# Patient Record
Sex: Female | Born: 1985 | Race: White | Hispanic: Yes | Marital: Married | Smoking: Never smoker
Health system: Southern US, Community
[De-identification: ages and names within clinical notes are randomized; demographics above are authoritative.]

## PROBLEM LIST (undated history)

## (undated) DIAGNOSIS — K649 Unspecified hemorrhoids: Secondary | ICD-10-CM

## (undated) HISTORY — DX: Unspecified hemorrhoids: K64.9

## (undated) HISTORY — PX: BREAST ENHANCEMENT SURGERY: SHX7

## (undated) HISTORY — PX: ABDOMINOPLASTY: SUR9

## (undated) HISTORY — PX: AUGMENTATION MAMMAPLASTY: SUR837

---

## 2015-01-26 HISTORY — PX: HEMORRHOID SURGERY: SHX153

## 2018-07-02 ENCOUNTER — Encounter: Payer: Self-pay | Admitting: *Deleted

## 2018-07-14 ENCOUNTER — Encounter: Payer: Self-pay | Admitting: Obstetrics & Gynecology

## 2018-07-19 ENCOUNTER — Encounter: Payer: Self-pay | Admitting: *Deleted

## 2018-07-21 ENCOUNTER — Ambulatory Visit (INDEPENDENT_AMBULATORY_CARE_PROVIDER_SITE_OTHER): Payer: Medicaid Other | Admitting: Obstetrics & Gynecology

## 2018-07-21 ENCOUNTER — Encounter: Payer: Self-pay | Admitting: Obstetrics & Gynecology

## 2018-07-21 VITALS — BP 117/76 | HR 69

## 2018-07-21 DIAGNOSIS — N644 Mastodynia: Secondary | ICD-10-CM | POA: Diagnosis present

## 2018-07-21 NOTE — Progress Notes (Signed)
Patient ID: Cynthia Huang, female   DOB: 1985/12/02, 32 y.o.   MRN: [id number]  Chief Complaint  Patient presents with  . Ovarian Cyst    HPI Cynthia Huang is a 32 y.o. female.  Single P1 28(34 yo daughter) here today with the complaint of RLQ pain. Present since 03/25/18 when her IUD (Paragard) was placed in [place]. She is not sure what kind of IUD she had in before that one.  She has also been having irregular periods since getting this IUD.  She also reports some pain in her right breast. She had [place].She denies nipple discharge. HPI  Past Medical History:  Diagnosis Date  . Hemorrhoids     Past Surgical History:  Procedure Laterality Date  . ABDOMINOPLASTY    . BREAST ENHANCEMENT SURGERY     twice  . CESAREAN SECTION    . HEMORRHOID SURGERY  01/2015   x3 due to malpractice    History reviewed. No pertinent family history.  Social History Social History   Tobacco Use  . Smoking status: Never Smoker  . Smokeless tobacco: Never Used  Substance Use Topics  . Alcohol use: Never    Frequency: Never  . Drug use: Never    No Known Allergies  Current Outpatient Medications  Medication Sig Dispense Refill  . Ascorbic Acid (VITAMIN C PO) Take 1 tablet by mouth daily.    Marland Kitchen. FOLIC ACID PO Take 1 tablet by mouth daily.    . Multiple Vitamin (MULTIVITAMIN) tablet Take 2 tablets by mouth daily.     No current facility-administered medications for this visit.     Review of Systems Review of Systems  Blood pressure 117/76, pulse 69, last menstrual period 07/07/2018.  Physical Exam Physical Exam  Breathing, conversing, and ambulating normally Well nourished, well hydrated Latina, no apparent distress  Live interpretor present for exam Breast exam normal bilaterally (implants present) Bimanual exam normal  Data Reviewed An u/s done 9/19 showed a 2 cm normal cyst on the left side (not the side of her  pain).   Assessment    RLQ pain- new onset since getting the Paragard. I have suggested that we remove it.  I have offered OCPs or Nexplanon  Right breast pain   She complains of pain in her breast and abdominoplasty scars  Plan    Diagnostic mammogram ordered I have suggested that she see a plastic surgeon regarding her scars       Allie BossierMyra C Liesa Tsan 07/21/2018, 3:41 PM

## 2018-07-21 NOTE — Progress Notes (Signed)
Here for c/o pain RLQ. [facility]

## 2018-07-27 ENCOUNTER — Other Ambulatory Visit: Payer: Self-pay | Admitting: Obstetrics & Gynecology

## 2018-07-27 DIAGNOSIS — N644 Mastodynia: Secondary | ICD-10-CM

## 2019-03-02 ENCOUNTER — Other Ambulatory Visit: Payer: Self-pay | Admitting: Internal Medicine

## 2019-03-02 DIAGNOSIS — Z1231 Encounter for screening mammogram for malignant neoplasm of breast: Secondary | ICD-10-CM

## 2019-04-04 ENCOUNTER — Ambulatory Visit: Payer: Medicaid Other | Attending: Sports Medicine | Admitting: Physical Therapy

## 2019-04-04 ENCOUNTER — Encounter: Payer: Self-pay | Admitting: Physical Therapy

## 2019-04-04 ENCOUNTER — Other Ambulatory Visit: Payer: Self-pay

## 2019-04-04 DIAGNOSIS — M62838 Other muscle spasm: Secondary | ICD-10-CM

## 2019-04-04 DIAGNOSIS — M25561 Pain in right knee: Secondary | ICD-10-CM

## 2019-04-04 DIAGNOSIS — M6281 Muscle weakness (generalized): Secondary | ICD-10-CM

## 2019-04-04 NOTE — Therapy (Signed)
[facility] [address], [address] [place], [place], [address] Phone: [phone]   Fax:  [phone]  Physical Therapy Evaluation  Patient Details  Name: Cynthia Huang MRN: [id number] Date of Birth: 06-20-1986 Referring Provider (PT): Pati GalloKramer, James, MD   Encounter Date: 04/04/2019  PT End of Session - 04/04/19 1620    Visit Number  1    Date for PT Re-Evaluation  06/13/19    Authorization Type  medicaid    PT Start Time  1533    PT Stop Time  1616    PT Time Calculation (min)  43 min    Activity Tolerance  Patient tolerated treatment well    Behavior During Therapy  [facility] for tasks assessed/performed       History reviewed. No pertinent past medical history.  Past Surgical History:  Procedure Laterality Date  . CESAREAN SECTION      There were no vitals filed for this visit.   Subjective Assessment - 04/04/19 1537    Subjective  Pt states she had to see a doctor for the pain two weeks ago.  I have been working out and have had a little bit of pain on and off but never something that limited me.  Now I cannot exercise    Patient is accompained by:  Interpreter    Limitations  Other (comment)   exercise   How long can you sit comfortably?  sitting 30 minutes increases pain and have to straighten leg    Patient Stated Goals  be able to exercise    Currently in Pain?  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    Referring Provider (PT)  Pati GalloKramer, James, MD    Onset Date/Surgical Date  --   2 weeks ago increased but has been ongoing for years   Prior Therapy  No      Precautions   Precautions  None      Restrictions   Weight Bearing Restrictions  No      Balance Screen   Has the patient fallen in the past 6 months  No      Home Environment   Living Environment  Private residence    Living Arrangements  Non-relatives/Friends;Children   daughter     Prior Function   Level of Independence  Independent    Vocation  Unemployed   due to COVID19 not been working   [employer] Requirements  pt was full time before COVID19; standing or sitting for long times    Leisure  exercise and running      Cognition   Overall Cognitive Status  Within Functional Limits for tasks assessed      Posture/Postural Control   Posture/Postural Control  No significant limitations      ROM / Strength   AROM / PROM / Strength  Strength      Strength   Strength Assessment Site  Hip;Knee    Right/Left Hip  Right;Left    Right Hip Flexion  5/5    Right Hip Extension  4-/5    Right Hip External Rotation   4-/5   pain   Right Hip Internal Rotation  4+/5    Right Hip ABduction  4-/5   pain   Right Hip ADduction  3+/5    Left Hip Flexion  5/5    Left Hip Extension  4+/5    Left Hip External Rotation  5/5    Left Hip Internal Rotation  5/5    Left Hip ABduction  5/5    Left Hip ADduction  4/5    Right/Left Knee  Right;Left    Right Knee Extension  4/5    Left Knee Extension  4/5      Flexibility   Soft Tissue Assessment /Muscle Length  yes    Hamstrings  right 10% limited    ITB  right 50% limite      Palpation   Patella mobility  hypomobile Rt side all directions    Palpation comment  trigger points and spasms along vastus lateralis; tight IT band      Special Tests    Special Tests  Lumbar    Lumbar Tests  Straight Leg Raise      Straight Leg Raise   Findings  Negative    Comment   bilat      Ambulation/Gait   Gait Pattern  Within Functional Limits                Objective measurements completed on examination: See above findings.              PT Education - 04/04/19 1619    Education Details  dry needling info and using spiky roller on thigh    Person(s) Educated  Patient    Methods  Explanation;Demonstration    Comprehension  Verbalized understanding;Returned demonstration          PT Long Term Goals - 04/04/19 1621      PT LONG TERM GOAL #1   Title  Pt will be able to run for at least 20 minutes without being limited by pain    Baseline  pain increases after 10 minutes, cannot run currently    Time  10    Period  Weeks    Status  New    Target Date  06/13/19      PT LONG TERM GOAL #2   Title  Pt will report 75% less pain     Baseline  up to 9/10 pain    Time  10    Period  Weeks    Status  New    Target Date  06/13/19      PT LONG TERM GOAL #3   Title  Pt will be able to sit for at least one hour without increased pain    Baseline  30 minuts causes increased pain    Time  10    Period  Weeks    Status  New    Target Date  06/13/19      PT LONG TERM GOAL #4   Title  Pt will demonstrate Rt hip abduction and adduction strength of MMT 5/5 for improved ability to run and return  to recreational activities.    Time  10    Period  Weeks    Status  New    Target Date  06/13/19      PT LONG TERM GOAL #5   Title  Pt will be able to ascend stairs reciprocally without increased pain    Baseline  up to 9/10 pain    Time  10    Period  Weeks    Status  New    Target Date  06/13/19             Plan - 04/04/19 1625    Clinical Impression Statement  Pt presents to the clinic today with Right knee pain that seems to radiate from mid lateral thigh/ITband region.  Pt is having pain when she runs and goes up and down stairs.  Pain began 2 weeks ago but she has had this pain to lesser degree for years.  Pt demonstrates LE  weakness as mentioned above.  She has trigger points along her lateral quad and very tight ITband.  Pt has decreased patellar mobility with patella tracking laterally during knee extension.  Pt is negative SLR test to rule out lumbar involvement.  Pt will benefit from skilled PT to improve strength and mobility for return to all recreational activities for healthy lifestyle.    Personal Factors and Comorbidities  Comorbidity 2    Comorbidities  chronic pain, c-section    Examination-Activity Limitations  Stairs    Examination-Participation Restrictions  Other    Stability/Clinical Decision Making  Stable/Uncomplicated    Clinical Decision Making  Low    Rehab Potential  Excellent    PT Frequency  2x / week    PT Duration  Other (comment)   10 weeks   PT Treatment/Interventions  ADLs/Self Care Home Management;Biofeedback;Cryotherapy;Electrical Stimulation;Iontophoresis 4mg /ml Dexamethasone;Moist Heat;Stair training;Gait training;Therapeutic activities;Therapeutic exercise;Neuromuscular re-education;Patient/family education;Manual techniques;Passive range of motion;Dry needling;Taping    PT Next Visit Plan  dry needling right vastus lateralis, tape Right knee to correct excess lateral glide, hip and quad strength    Consulted and Agree with Plan of Care  Patient       Patient will benefit from skilled therapeutic intervention in order to improve the following deficits and impairments:  Increased muscle spasms, Decreased strength, Pain, Hypomobility  Visit Diagnosis: Acute pain of right knee - Plan: PT plan of care cert/re-cert  Muscle weakness (generalized) - Plan: PT plan of care cert/re-cert  Other muscle spasm - Plan: PT plan of care cert/re-cert     Problem List There are no active problems to display for this patient.   Brayton CavesJakki L Marielle Mantione 04/04/2019, 6:12 PM  [facility] [address], [address] [place], [place],  [address] Phone: [phone]   Fax:  [phone]  Name: Cynthia Huang MRN: [id number] Date of Birth: 1986-07-12

## 2019-04-04 NOTE — Patient Instructions (Signed)

## 2019-04-06 ENCOUNTER — Encounter: Payer: Self-pay | Admitting: Obstetrics & Gynecology

## 2019-04-13 ENCOUNTER — Encounter: Payer: Self-pay | Admitting: Physical Therapy

## 2019-04-13 ENCOUNTER — Other Ambulatory Visit: Payer: Self-pay

## 2019-04-13 ENCOUNTER — Ambulatory Visit: Payer: Medicaid Other | Admitting: Physical Therapy

## 2019-04-13 DIAGNOSIS — M62838 Other muscle spasm: Secondary | ICD-10-CM

## 2019-04-13 DIAGNOSIS — M25561 Pain in right knee: Secondary | ICD-10-CM

## 2019-04-13 DIAGNOSIS — M6281 Muscle weakness (generalized): Secondary | ICD-10-CM

## 2019-04-13 NOTE — Therapy (Signed)
[facility] [address], [address] [place], [place], [address] Phone: [phone]   Fax:  [phone]  Physical Therapy Treatment  Patient Details  Name: Cynthia Huang MRN: [id number] Date of Birth: 07-01-1986 Referring Provider (PT): Pati GalloKramer, James, MD   Encounter Date: 04/13/2019  PT End of Session - 04/13/19 1523    Visit Number  2    Date for PT Re-Evaluation  06/13/19    Authorization Type  medicaid    PT Start Time  1523    PT Stop Time  1605    PT Time Calculation (min)  42 min    Activity Tolerance  Patient tolerated treatment well    Behavior During Therapy  [facility] for tasks assessed/performed       Past Medical History:  Diagnosis Date  . Hemorrhoids     Past Surgical History:  Procedure Laterality Date  . ABDOMINOPLASTY    . BREAST ENHANCEMENT SURGERY     twice  . CESAREAN SECTION    . HEMORRHOID SURGERY  01/2015   x3 due to malpractice    There were no vitals filed for this visit.  Subjective Assessment - 04/13/19 1528    Subjective  Pt states she has a little pain when she sits for a long time but other than that feels completely better. She has not returned to running yet.    Currently in Pain?  No/denies                       [facility] - 04/13/19 0001      Exercises   Exercises  Knee/Hip      Knee/Hip Exercises: Stretches   ITB Stretch  Right;3 reps;20 seconds      Knee/Hip Exercises: Standing   Step Down  Step Height: 4";Hand Hold: 2;10 reps   cues for alignment   Other Standing Knee Exercises  sliders 3 ways with cues for LE and pelvic aligned neutral positions - 10x each way      Knee/Hip Exercises: Sidelying   Other Sidelying Knee/Hip Exercises  foam roll to IT band and TFL      Manual Therapy   Manual Therapy  Soft tissue mobilization    Soft tissue mobilization  TFL, and quads - trigger point release       Trigger Point  Dry Needling - 04/13/19 0001    Consent Given?  Yes    Education Handout Provided  Previously provided    Muscles Treated Lower Quadrant  Vastus lateralis;Vastus medialis    Other Dry Needling  TFL - twitch and increased muscle length noted    Vastus lateralis Response  Twitch response elicited;Palpable increased muscle length    Vastus medialis Response  Twitch response elicited;Palpable increased muscle length                PT Long Term Goals - 04/13/19 1611      PT LONG TERM GOAL #1   Title  Pt will be able to run for at least 20 minutes without being limited by pain    Baseline  no pain currently but has not tried running    Status  On-going      PT LONG TERM GOAL #2   Title  Pt will report 75% less pain     Baseline  no pain    Status  Achieved      PT LONG TERM GOAL #3  Title  Pt will be able to sit for at least one hour without increased pain    Baseline  less pain with sitting, very little    Status  On-going      PT LONG TERM GOAL #4   Title  Pt will demonstrate Rt hip abduction and adduction strength of MMT 5/5 for improved ability to run and return to recreational activities.    Baseline  just learned exercises    Status  On-going      PT LONG TERM GOAL #5   Title  Pt will be able to ascend stairs reciprocally without increased pain    Baseline  only a very little pain    Status  On-going            Plan - 04/13/19 1612    Clinical Impression Statement  Pt states she hasn't been having any pain and she is feeling much better.  She states rolling out her leg has helped a lot.  Pt feels like she may not need more PT, however PT recommends to continue at least one more visit to ensure she can return to her normal activities.  She needed cues with single leg exercises due to weakness in hips.  Pt also had tirgger points along vastus lateralis and TFL muscle responded well to manaul and dry needling techniques.  Pt will benefit from skilled PT to progress  strength and work on jumping and hopping activities so she can safely return to sports.    PT Treatment/Interventions  ADLs/Self Care Home Management;Biofeedback;Cryotherapy;Electrical Stimulation;Iontophoresis 4mg /ml Dexamethasone;Moist Heat;Stair training;Gait training;Therapeutic activities;Therapeutic exercise;Neuromuscular re-education;Patient/family education;Manual techniques;Passive range of motion;Dry needling;Taping    PT Next Visit Plan  f/u on dry needling, hopping and jumping with good technique so she can return to sports    PT Home Exercise Plan  Your Access Code [phone]    Recommended Other Services  cert signed    Consulted and Agree with Plan of Care  Patient       Patient will benefit from skilled therapeutic intervention in order to improve the following deficits and impairments:  Increased muscle spasms, Decreased strength, Pain, Hypomobility  Visit Diagnosis: 1. Acute pain of right knee   2. Muscle weakness (generalized)   3. Other muscle spasm        Problem List There are no active problems to display for this patient.   Camillo Flaming Cynthia Huang, PT 04/13/2019, 4:24 PM  [facility] [address], [place], [place], [address] Phone: [phone]   Fax:  [phone]  Name: Cynthia Huang MRN: [id number] Date of Birth: May 30, 1986

## 2019-04-20 ENCOUNTER — Encounter: Payer: Self-pay | Admitting: Physical Therapy

## 2019-04-20 ENCOUNTER — Other Ambulatory Visit: Payer: Self-pay

## 2019-04-20 ENCOUNTER — Ambulatory Visit: Payer: Medicaid Other | Admitting: Physical Therapy

## 2019-04-20 DIAGNOSIS — M6281 Muscle weakness (generalized): Secondary | ICD-10-CM

## 2019-04-20 DIAGNOSIS — M25561 Pain in right knee: Secondary | ICD-10-CM

## 2019-04-20 DIAGNOSIS — M62838 Other muscle spasm: Secondary | ICD-10-CM

## 2019-04-20 NOTE — Patient Instructions (Signed)
Access Code: [email or web address]  URL: [email or web address]  Date: 04/20/2019  Prepared by: Jari Favre   Exercises  Forward Step Down - 10 reps - 3 sets - 1x daily - 7x weekly  3-Way Lunge on Slider - 10 reps - 3 sets - 1x daily - 7x weekly  Reverse Lunge on Slider - 10 reps - 3 sets - 1x daily - 7x weekly  Standing ITB Stretch - 10 reps - 3 sets - 1x daily - 7x weekly  Sidelying IT Band Foam Roll Mobilization - 1 reps - 1 sets - 60 sec hold - 1x daily - 7x weekly  Squat Jumps - 10 reps - 2 sets - 1x daily - 7x weekly  Lateral Single Leg Lunge Jumps - 10 reps - 2 sets - 1x daily - 7x weekly  Standing Quadriceps Stretch - 3 reps - 1 sets - 30 sec hold - 1x daily - 7x weekly  Supine Piriformis Stretch with Foot on Ground - 3 reps - 1 sets - 30 sec hold - 1x daily - 7x weekly

## 2019-04-20 NOTE — Therapy (Addendum)
[facility] [address], [place], [place], [address] Phone: [phone]   Fax:  [phone]  Physical Therapy Treatment  Patient Details  Name: Cynthia Huang MRN: [id number] Date of Birth: 1985-12-12 Referring Provider (PT): Berle Mull, MD   Encounter Date: 04/20/2019  PT End of Session - 04/20/19 1533    Visit Number  3    Date for PT Re-Evaluation  06/13/19    Authorization Type  medicaid    PT Start Time  1532    PT Stop Time  1610    PT Time Calculation (min)  38 min    Activity Tolerance  Patient tolerated treatment well    Behavior During Therapy  [facility] for tasks assessed/performed       Past Medical History:  Diagnosis Date  . Hemorrhoids     Past Surgical History:  Procedure Laterality Date  . ABDOMINOPLASTY    . BREAST ENHANCEMENT SURGERY     twice  . CESAREAN SECTION    . HEMORRHOID SURGERY  01/2015   x3 due to malpractice    There were no vitals filed for this visit.  Subjective Assessment - 04/20/19 1538    Subjective  Pt states she has a little pain when she sits for a long time.    How long can you sit comfortably?  30-40 minutes    Currently in Pain?  No/denies                       [facility] Adult PT Treatment/Exercise - 04/20/19 0001      Neuro Re-ed    Neuro Re-ed Details   knee alignment during exercises      Knee/Hip Exercises: Stretches   Active Hamstring Stretch  Right;Left;3 reps;20 seconds    Quad Stretch  Right;Left;3 reps;20 seconds    Hip Flexor Stretch  Right;Left;3 reps;20 seconds    ITB Stretch  Right;3 reps;20 seconds    Piriformis Stretch  Right;Left;3 reps;20 seconds    Gastroc Stretch  Right;Left;1 rep;30 seconds      Knee/Hip Exercises: Plyometrics   Other Plyometric Exercises  jump squats - 10x with mirror for cues    Other Plyometric Exercises  lateral single leg hop - 10x each way      Manual Therapy   Soft tissue  mobilization  TFL, and quads - trigger point release       Trigger Point Dry Needling - 04/20/19 0001    Consent Given?  Yes    Other Dry Needling  TFL - twitch and increased muscle length noted    Vastus lateralis Response  Twitch response elicited;Palpable increased muscle length           PT Education - 04/20/19 1617    Education Details  Access Code: [email or web address]    Person(s) Educated  Patient    Methods  Explanation;Demonstration;Tactile cues;Verbal cues;Handout    Comprehension  Verbalized understanding;Returned demonstration          PT Long Term Goals - 04/13/19 1611      PT LONG TERM GOAL #1   Title  Pt will be able to run for at least 20 minutes without being limited by pain    Baseline  no pain currently but has not tried running    Status  On-going      PT LONG TERM GOAL #2   Title  Pt will report 75% less pain     Baseline  no pain    Status  Achieved      PT LONG TERM GOAL #3   Title  Pt will be able to sit for at least one hour without increased pain    Baseline  less pain with sitting, very little    Status  On-going      PT LONG TERM GOAL #4   Title  Pt will demonstrate Rt hip abduction and adduction strength of MMT 5/5 for improved ability to run and return to recreational activities.    Baseline  just learned exercises    Status  On-going      PT LONG TERM GOAL #5   Title  Pt will be able to ascend stairs reciprocally without increased pain    Baseline  only a very little pain    Status  On-going            Plan - 04/20/19 1619    Clinical Impression Statement  Pt did well working with mirror to show the instability in Rt LE/hip.  Pt continues to have trigger points along TFL and latera quadricep.  She has overall improvements and is able to sit longer with less pain.  She has not tried running yet.  She will benefit from skilled PT to re-assess and progress HEP as needed after she tries running again.    PT Treatment/Interventions   ADLs/Self Care Home Management;Biofeedback;Cryotherapy;Electrical Stimulation;Iontophoresis 1m/ml Dexamethasone;Moist Heat;Stair training;Gait training;Therapeutic activities;Therapeutic exercise;Neuromuscular re-education;Patient/family education;Manual techniques;Passive range of motion;Dry needling;Taping    PT Next Visit Plan  f/u on response to running    PT Home Exercise Plan  Your Access Code [email or web address]   Consulted and Agree with Plan of Care  Patient       Patient will benefit from skilled therapeutic intervention in order to improve the following deficits and impairments:  Increased muscle spasms, Decreased strength, Pain, Hypomobility  Visit Diagnosis: 1. Acute pain of right knee   2. Muscle weakness (generalized)   3. Other muscle spasm        Problem List There are no active problems to display for this patient.   JJule Ser PT 04/20/2019, 4:26 PM  [facility] [address] [place] [address]Phone: [phone]  Fax:  [phone] Name: Cynthia CalvarioMRN: [id number] of Birth: 426-Sep-1987 PHYSICAL THERAPY DISCHARGE SUMMARY  Visits from Start of Care: 3  Current functional level related to goals / functional outcomes: See above goals   Remaining deficits: See above details   Education / Equipment: HEP  Plan: Patient agrees to discharge.  Patient goals were partially met. Patient is being discharged due to not returning since the last visit.  ?????    [employer] PT 06/23/19 11:00 AM

## 2019-04-25 ENCOUNTER — Encounter: Payer: Medicaid Other | Admitting: Physical Therapy

## 2019-04-27 ENCOUNTER — Other Ambulatory Visit: Payer: Self-pay | Admitting: Internal Medicine

## 2019-04-27 ENCOUNTER — Other Ambulatory Visit: Payer: Self-pay

## 2019-04-27 ENCOUNTER — Ambulatory Visit
Admission: RE | Admit: 2019-04-27 | Discharge: 2019-04-27 | Disposition: A | Discharge status: Home / Self Care | Payer: Medicaid Other | Source: Ambulatory Visit | Attending: Internal Medicine | Admitting: Internal Medicine

## 2019-04-27 DIAGNOSIS — Z1231 Encounter for screening mammogram for malignant neoplasm of breast: Secondary | ICD-10-CM

## 2019-10-04 IMAGING — MG DIGITAL SCREENING BILATERAL MAMMOGRAM WITH IMPLANTS, CAD AND TOM
9 of 12 series · 9 of 28 positions shown · non-contrast
Comparison: Previous exam(s).

CLINICAL DATA: Screening.

EXAM:
DIGITAL SCREENING BILATERAL MAMMOGRAM WITH IMPLANTS, CAD AND TOMO
The patient has prepectoral implants. Standard and implant displaced
views were performed.

[R MLO]
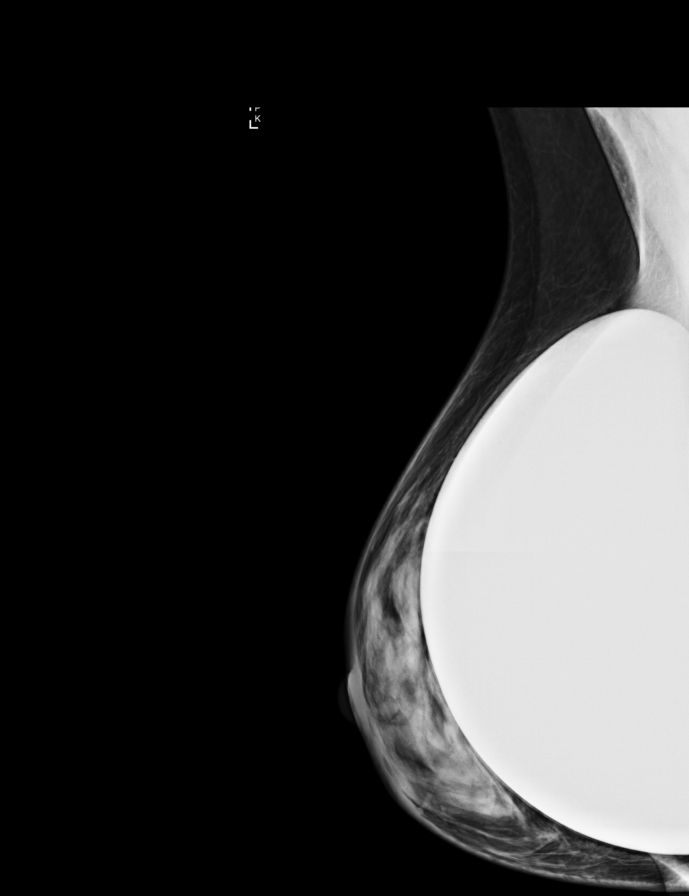

[L MLO]
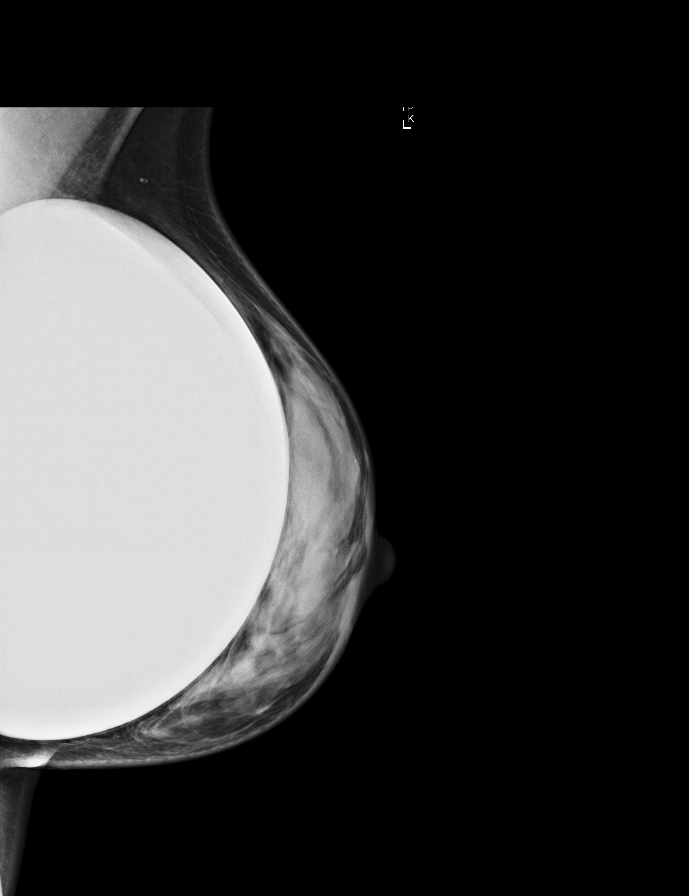

[L CC]
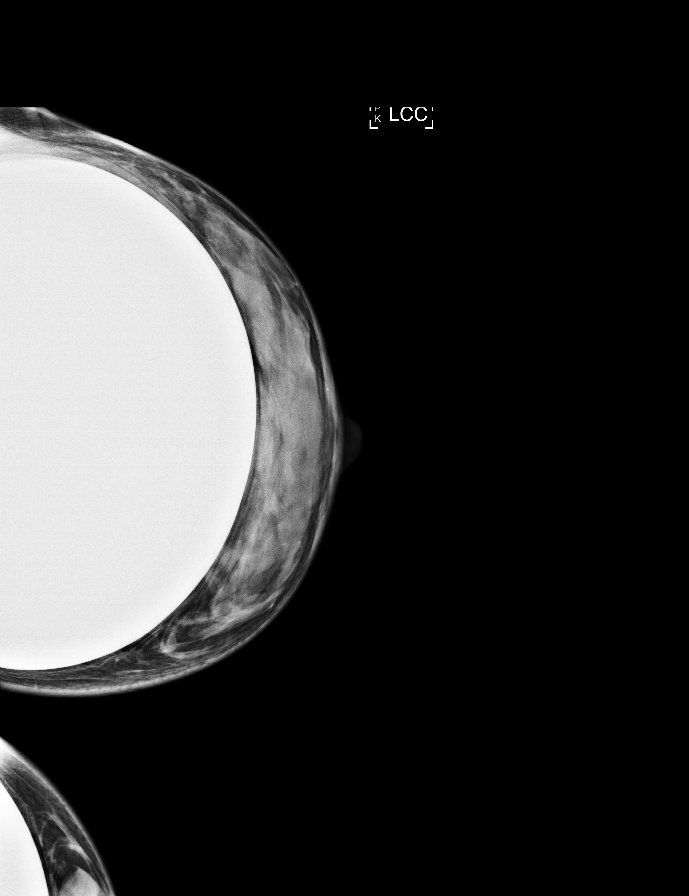

[R CC]
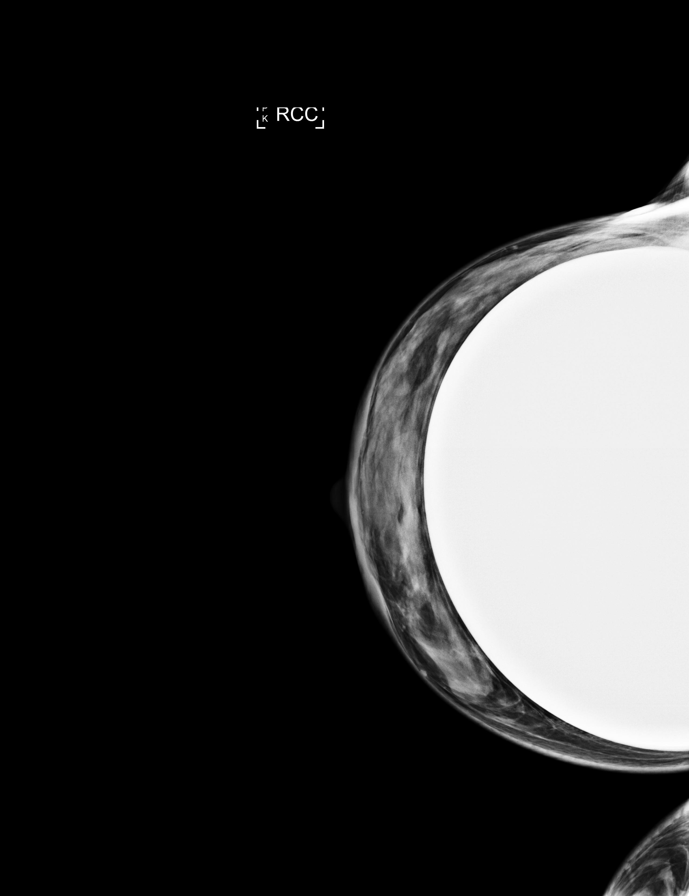

[L CC synth-2D]
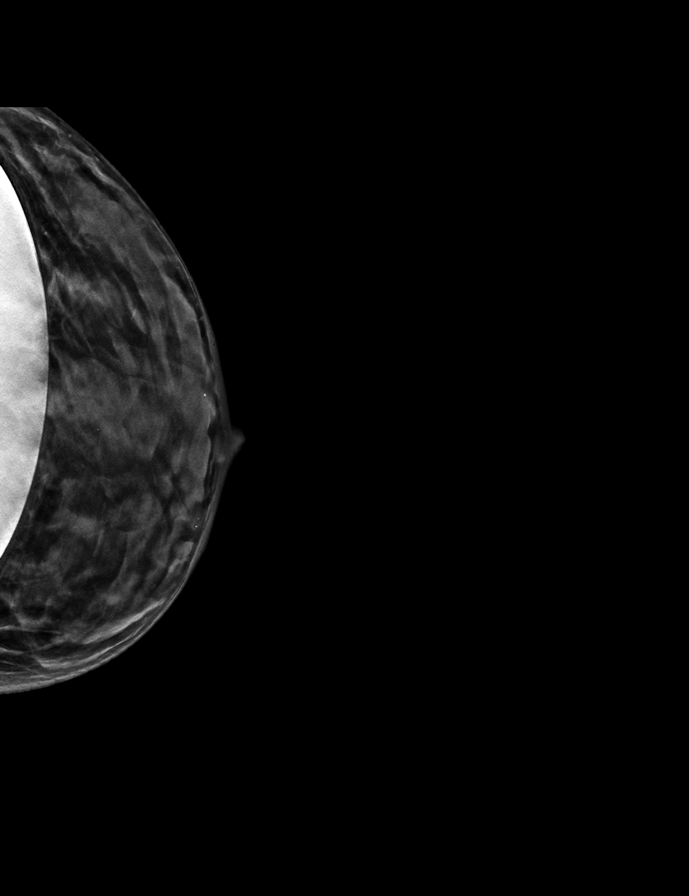

[R MLO synth-2D]
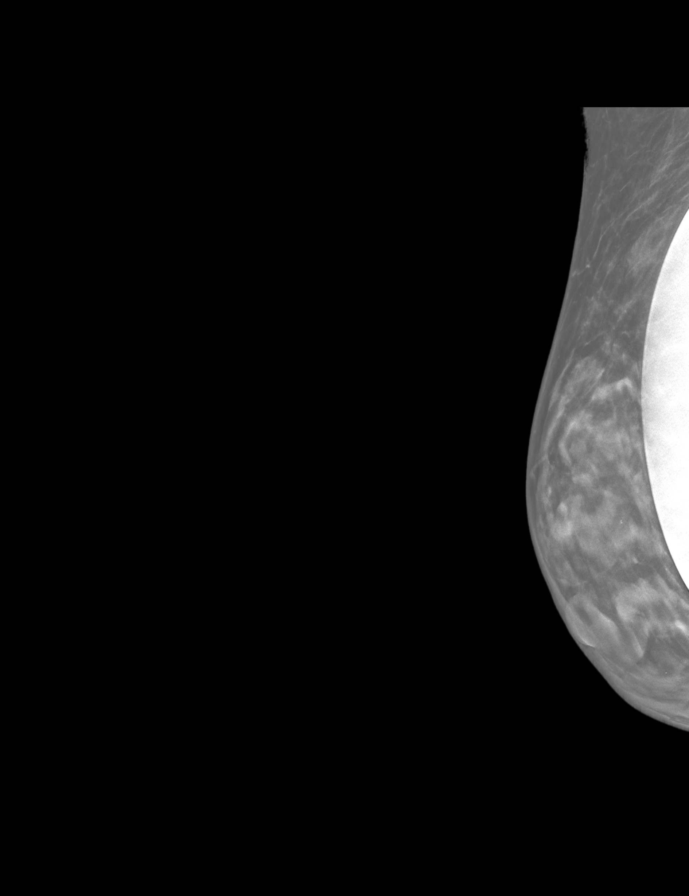

[L MLO synth-2D]
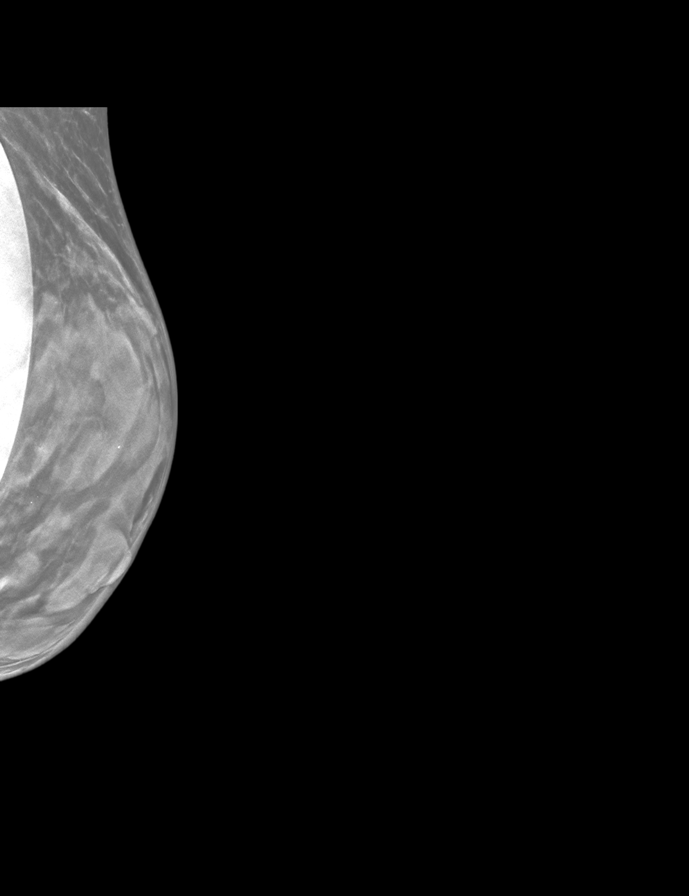

[R CC synth-2D]
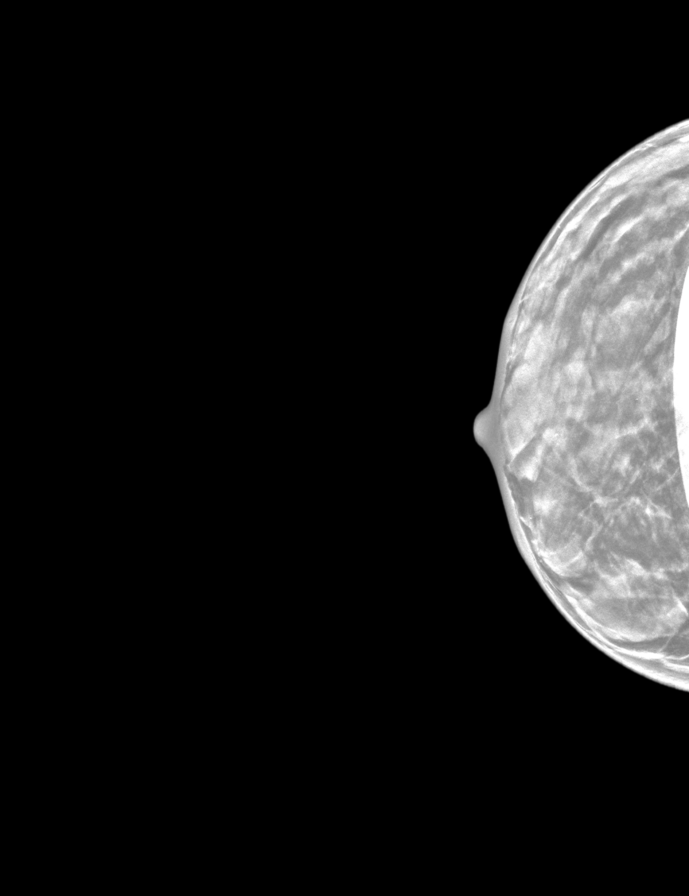

[R MLOID BREAST TOMOSYNTHESIS IMAGE tomo · tomo slice 21/41.0]
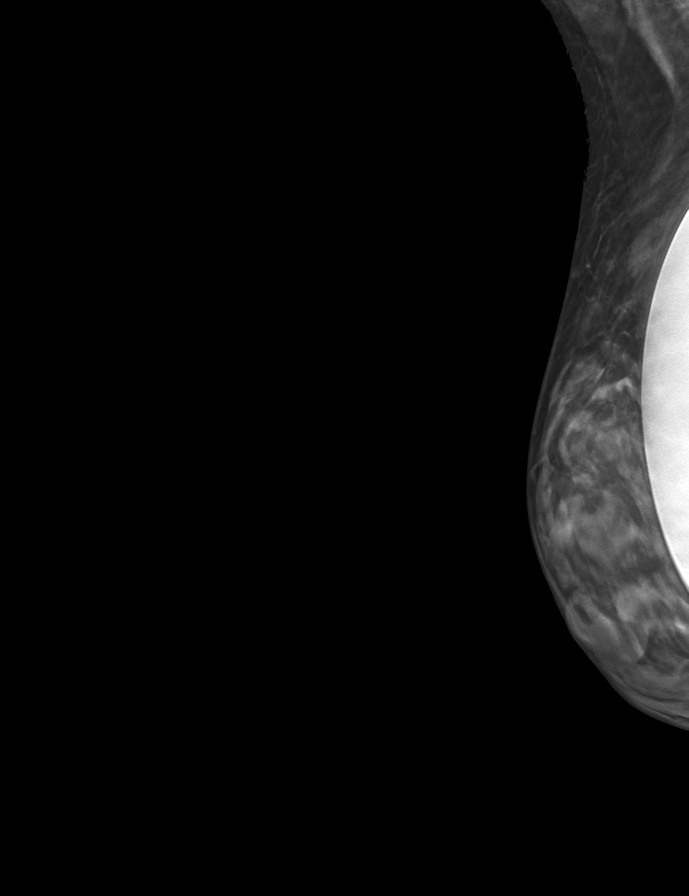

[9 of 28 positions shown; findings below may reference images not displayed]

ACR Breast Density Category d: The breast tissue is extremely dense,
which lowers the sensitivity of mammography.
FINDINGS: There are no findings suspicious for malignancy. Images were
processed with CAD.
IMPRESSION: No mammographic evidence of malignancy. A result letter of this
screening mammogram will be mailed directly to the patient.

RECOMMENDATION:
Screening mammogram at age 38. (Code:67-9-R43)

BI-RADS CATEGORY  1:  Negative.

## 2021-10-10 ENCOUNTER — Ambulatory Visit: Payer: Medicaid Other | Admitting: Infectious Diseases

## 2021-10-16 ENCOUNTER — Other Ambulatory Visit: Payer: Self-pay

## 2021-10-16 ENCOUNTER — Encounter: Payer: Self-pay | Admitting: Infectious Diseases

## 2021-10-16 ENCOUNTER — Ambulatory Visit (INDEPENDENT_AMBULATORY_CARE_PROVIDER_SITE_OTHER): Payer: Medicaid Other | Admitting: Infectious Diseases

## 2021-10-16 DIAGNOSIS — L03011 Cellulitis of right finger: Secondary | ICD-10-CM

## 2021-10-16 DIAGNOSIS — R768 Other specified abnormal immunological findings in serum: Secondary | ICD-10-CM

## 2021-10-16 NOTE — Patient Instructions (Addendum)
You could take zinc 50 mg once a day for wound healing Also Vitamin A through a multivitamin may be helpful too for wound healing  Vitamin D once daily I recommend everyone to take also.   I would like to discuss further with Dr. Mina Marble about you tomorrow. You have been on a lot of antibiotics that I would have suspected would have worked by now so we need to be more thoughtful about what we should do next for you.

## 2021-10-16 NOTE — Progress Notes (Signed)
Patient: Cynthia Huang  DOB: 02/04/1986 MRN: [id number] PCP: Pcp, No  Referring Provider: Dr. Dairl Ponder   Subjective:  Alinda Sierras de Jayme Cloud is a 35 y.o. 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   She declined an interpretor today. Her husband / partner is present.   Condition started on October 7th, 2022 with fairly rapid onset erythema, pain/tenderness and swelling of the nailbed of her right middle finger. She sought care with PCP @ [facility] after the condition worsened over 1 week on Nov 7th. They tried to drain it in the office - this yielded no purulent material, only blood. She was treated with a 7 day course of Augmentin at that time. With no improvement and now worsened swelling they added doxycycline to this and referred to Dr. Mina Marble for further assistance. She states she noticed very little to no improvement at this point.   She does not recall any injury to the nail bed. Does her own nails at home, no gel or acrylics. Does not manicure with any instruments just paints them usually. She does not bite her nails and had no human or animal bite at work or home. Washes hands frequently at work and home with medical grade soap. 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 Has been out of work since end of October.   She saw Dr. Mina Marble - given a course of Bactrim on 11/07 and had procedure November 16th where he elevated the nail plate off the finger, cleaned debri with curette to epiconchial fold and obtained cultures. Cultures were negative. Continued to have bad pain, swelling and intermittent drainage. X'rays revealed no bone infection. Started on a second round of bactrim and diflucan late November and ultimately underwent nail bed removal on December 2nd. She says that at first after the nail was removed the pain was worse but the swelling that was present down the finger shaft started to improve but  persisted around the nail bed.   Started on systemic steroids recently 12/19 - this is the second rx for it but she confides she did not take it at first because of concern over impact on her immune system. She feels a lot better today and notes a significant improvement the last 24 hours - no pain or drainage. She is not on any antibiotics at this moment but using cetaphil soap soaking with antibiotic creams. She keeps it covered all of the time.    She has had a lot of blood work drawn with PCP and a neurologist including immunoglobulins, thyroid studies, lyme studies, ANA and RF amongst others.  RA panel elevated at 30; ANA negative, CRP negative. Lyme WB with few reactive IgG bands but ultimately negative interpretation.   Leaving for [place] on Friday and returning on January 2nd.    Review of Systems  Constitutional:  Negative for chills, fever, malaise/fatigue and weight loss.  HENT:  Negative for sore throat.   Respiratory:  Negative for cough and sputum production.   Cardiovascular:  Negative for chest pain and leg swelling.  Gastrointestinal:  Negative for abdominal pain, diarrhea and vomiting.  Genitourinary:  Negative for dysuria and flank pain.  Musculoskeletal:  Positive for joint pain (Rt long finger tip). Negative for myalgias and neck pain.  Skin:  Negative for rash.  Neurological:  Negative for dizziness, tingling and headaches.  Psychiatric/Behavioral:  Negative for depression and substance abuse. The patient is not nervous/anxious and  does not have insomnia.     Past Medical History:  Diagnosis Date   Hemorrhoids     Outpatient Medications Prior to Visit  Medication Sig Dispense Refill   Ascorbic Acid (VITAMIN C PO) Take 1 tablet by mouth daily.     FOLIC ACID PO Take 1 tablet by mouth daily.     Multiple Vitamin (MULTIVITAMIN) tablet Take 2 tablets by mouth daily.     No facility-administered medications prior to visit.     No Known Allergies  Social  History   Tobacco Use   Smoking status: Never   Smokeless tobacco: Never  Substance Use Topics   Alcohol use: Never   Drug use: Never    No family history on file.  Objective:   Vitals:   10/16/21 1604  BP: 114/77  Pulse: 77  Temp: 98.5 F (36.9 C)  TempSrc: Oral  Weight: 117 lb (53.1 kg)   There is no height or weight on file to calculate BMI.  Physical Exam Constitutional:      Appearance: Normal appearance. She is not ill-appearing.  HENT:     Mouth/Throat:     Mouth: Mucous membranes are moist.     Pharynx: Oropharynx is clear.  Eyes:     General: No scleral icterus. Pulmonary:     Effort: Pulmonary effort is normal.  Neurological:     Mental Status: She is oriented to person, place, and time.  Psychiatric:        Mood and Affect: Mood normal.        Thought Content: Thought content normal.    10/16/2021   November 8th     Lab Results: No results found for: WBC, HGB, HCT, MCV, PLT No results found for: CREATININE, BUN, NA, K, CL, CO2 No results found for: ALT, AST, GGT, ALKPHOS, BILITOT   Assessment & Plan:   Problem List Items Addressed This Visit       Unprioritized   Elevated rheumatoid factor    She does not have any clinical symptoms c/w RA from our discussion. Unclear if this is only indicative of acute phase reactant vs positive test preceeding illness. She has an appt with Rheumatology soon.  ANA negative.  No evidence of Raynauds suggested on exam or history that would lead to delay in healing.  It is not clear to my why she would have needed Lyme testing but the few bands could be cross reactive from another process yet to be realized.       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 She has been treated with multiple rounds of appropriate antibiotics targeting the most common bacteria for acute paronychia including augmentin,  doxy, bactrim, and diflucan. Most recent antibiotic course Bactrim from 11/28 - 12/12. Ultimately she underwent nail plate removal on December 2nd. She does not report that she experienced much improvement on any of the antimicrobials. She is currently on systemic steroid for 6 days and cetaphil soaks. She confides she was nervous about using the steroid and did not take it at first when initially prescribed because of impact on her immune system but out of desperation started it recently.  She has noticed a significant amount of improvement the last 24 hours with this treatment.  On exam she has residual swelling around the nailbed with no pain/tenderness with firm pressure, no erythema, no drainage. She has had 2  xrays one most recently 12/19 - though I don't have a report or way to manipulate the images they report that Dr. Mina Marble reports no concern for bone infection. Would recommend to hold further abx and continue current steroid and soak rx. Even with chronic fungal involvement steroids seem to be more effective (topical or systemic).   She is wondering when she can go back to work (has been out since October) - I told her I would rather [place] see more than 24 hours of improvement given she exposes her hands to washing/cleaning products frequently at work.   Will have her back in to see one of our MDs when she returns from a trip to [place] early January to follow closely.   I called to speak with Dr. Mina Marble about the plan to hold on antibiotics for now and patient's improvement recently.       Total Encounter Time: 45 min   Rexene Alberts, MSN, NP-C [facility] Pager: [phone] Office: [phone]  10/17/21  11:18 AM

## 2021-10-17 DIAGNOSIS — R768 Other specified abnormal immunological findings in serum: Secondary | ICD-10-CM | POA: Insufficient documentation

## 2021-10-17 DIAGNOSIS — L03011 Cellulitis of right finger: Secondary | ICD-10-CM | POA: Insufficient documentation

## 2021-10-17 NOTE — Assessment & Plan Note (Signed)
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 She has been treated with multiple rounds of appropriate antibiotics targeting the most common bacteria for acute paronychia including augmentin, doxy, bactrim, and diflucan. Most recent antibiotic course Bactrim from 11/28 - 12/12. Ultimately she underwent nail plate removal on December 2nd. She does not report that she experienced much improvement on any of the antimicrobials. She is currently on systemic steroid for 6 days and cetaphil soaks. She confides she was nervous about using the steroid and did not take it at first when initially prescribed because of impact on her immune system but out of desperation started it recently.  She has noticed a significant amount of improvement the last 24 hours with this treatment.  On exam she has residual swelling around the nailbed with no pain/tenderness with firm pressure, no erythema, no drainage. She has had 2 xrays one most recently 12/19 - though I don't have a report or way to manipulate the images they report that Dr. Mina Marble reports no concern for bone infection. Would recommend to hold further abx and continue current steroid and soak rx. Even with chronic fungal involvement steroids seem to be more effective (topical or systemic).   She is wondering when she can go back to work (has been out since October) - I told her I would rather [place] see more than 24 hours of improvement given she exposes her hands to washing/cleaning products frequently at work.   Will have her back in to see one of our MDs when she returns from a trip to [place] early January to follow closely.   I called to speak with Dr. Mina Marble about the plan to hold on antibiotics for now and patient's improvement recently.

## 2021-10-17 NOTE — Assessment & Plan Note (Signed)
She does not have any clinical symptoms c/w RA from our discussion. Unclear if this is only indicative of acute phase reactant vs positive test preceeding illness. She has an appt with Rheumatology soon.  ANA negative.  No evidence of Raynauds suggested on exam or history that would lead to delay in healing.  It is not clear to my why she would have needed Lyme testing but the few bands could be cross reactive from another process yet to be realized.

## 2021-11-04 ENCOUNTER — Ambulatory Visit (INDEPENDENT_AMBULATORY_CARE_PROVIDER_SITE_OTHER): Payer: Medicaid Other | Admitting: Infectious Diseases

## 2021-11-04 ENCOUNTER — Other Ambulatory Visit: Payer: Self-pay

## 2021-11-04 ENCOUNTER — Encounter: Payer: Self-pay | Admitting: Infectious Diseases

## 2021-11-04 DIAGNOSIS — L03011 Cellulitis of right finger: Secondary | ICD-10-CM

## 2021-11-04 NOTE — Patient Instructions (Addendum)
I don't think you have any ongoing bacterial infection causing the swelling in your finger.   Topical Triamcinolone to the nailbed may be helpful since you have had improvement on the steroid pill. This may be helpful for you to avoid having to take steroids by mouth too long. I would see how your finger does the rest of this week and if the swelling comes back call Dr. Ronie Spies team about starting this topical cream.   Compared to a few weeks ago I would say you do look better.   If you have some features of Raynaud's Phenomenon I would take care to keep your hands warm and see below. This may be a reason you have been slow to heal if you do have some of these features.   Raynaud's Phenomenon Raynaud's phenomenon is a condition that affects the blood vessels (arteries) that carry blood to the fingers and toes. The arteries that supply blood to the ears, lips, nipples, or the tip of the nose might also be affected. Raynaud's phenomenon causes the arteries to become narrow temporarily (spasm). As a result, the flow of blood to the affected areas is temporarily decreased. This usually occurs in response to cold temperatures or stress. During an attack, the skin in the affected areas turns white, then blue, and finally red. A person may also feel tingling or numbness in those areas. Attacks usually last for only a brief period, and then the blood flow to the area returns to normal. In most cases, Raynaud's phenomenon does not cause serious health problems. What are the causes? In many cases, the cause of this condition is not known. The condition may occur on its own (primary Raynaud's phenomenon) or may be associated with other diseases or factors (secondary Raynaud's phenomenon). Possible causes may include: Diseases or medical conditions that damage the arteries. Injuries and repetitive actions that hurt the hands or feet. Being exposed to certain chemicals. Taking medicines that narrow the  arteries. Other medical conditions, such as lupus, scleroderma, rheumatoid arthritis, thyroid problems, blood disorders, Sjogren syndrome, or atherosclerosis. What increases the risk? The following factors may make you more likely to develop this condition: Being 48-59 years old. Being female. Having a family history of Raynaud's phenomenon. Living in a cold climate. Smoking. What are the signs or symptoms? Symptoms of this condition usually occur when you are exposed to cold temperatures or when you have emotional stress. The symptoms may last for a few minutes or up to several hours. They usually affect your fingers but may also affect your toes, nipples, lips, ears, or the tip of your nose. Symptoms may include: Changes in skin color. The skin in the affected areas will turn pale or white. The skin may then change from white to bluish to red as normal blood flow returns to the area. Numbness, tingling, or pain in the affected areas. In severe cases, symptoms may include: Skin sores. Tissues decaying and dying (gangrene). How is this diagnosed? This condition may be diagnosed based on: Your symptoms and medical history. A physical exam. During the exam, you may be asked to put your hands in cold water to check for a reaction to cold temperature. Tests, such as: Blood tests to check for other diseases or conditions. A test to check the movement of blood through your arteries and veins (vascular ultrasound). A test in which the skin at the base of your fingernail is examined under a microscope (nailfold capillaroscopy). How is this treated? During an episode,  you can take actions to help symptoms go away faster. Options include moving your arms around in a windmill pattern, warming your fingers under warm water, or placing your fingers in a warm body fold, such as your armpit. Long-term treatment for this condition often involves making lifestyle changes and taking steps to control your  exposure to cold temperature. For more severe cases, medicine (calcium channel blockers) may be used to improve blood circulation. Follow these instructions at home: Avoiding cold temperatures Take these steps to avoid exposure to cold: If possible, stay indoors during cold weather. When you go outside during cold weather, dress in layers and wear mittens, a hat, a scarf, and warm footwear. Wear mittens or gloves when handling ice or frozen food. Use holders for glasses or cans containing cold drinks. Let warm water run for a while before taking a shower or bath. Warm up the car before driving in cold weather. Lifestyle If possible, avoid stressful and emotional situations. Try to find ways to manage your stress, such as: Exercise. Yoga. Meditation. Biofeedback. Do not use any products that contain nicotine or tobacco. These products include cigarettes, chewing tobacco, and vaping devices, such as e-cigarettes. If you need help quitting, ask your health care provider. Avoid secondhand smoke. Limit your use of caffeine. Switch to decaffeinated coffee, tea, and soda. Avoid chocolate. Avoid vibrating tools and machinery. General instructions Protect your hands and feet from injuries, cuts, or bruises. Avoid wearing tight rings or wristbands. Wear loose fitting socks and comfortable, roomy shoes. Take over-the-counter and prescription medicines only as told by your health care provider. Where to find support Raynaud's Association: www.raynauds.org Where to find more information [employer] of Arthritis and Musculoskeletal and Skin Diseases: www.niams.http://www.myers.net/ Contact a health care provider if: Your discomfort becomes worse despite lifestyle changes. You develop sores on your fingers or toes that do not heal. You have breaks in the skin on your fingers or toes. You have a fever. You have pain or swelling in your joints. You have a rash. Your symptoms occur on only one side of  your body. Get help right away if: Your fingers or toes turn black. You have severe pain in the affected areas. These symptoms may represent a serious problem that is an emergency. Do not wait to see if the symptoms will go away. Get medical help right away. Call your local emergency services (911 in the [place] Do not drive yourself to the hospital. Summary Raynaud's phenomenon is a condition that affects the arteries that carry blood to the fingers, toes, ears, lips, nipples, or the tip of the nose. In many cases, the cause of this condition is not known. Symptoms of this condition include changes in skin color along with numbness and tingling in the affected area. Treatment for this condition includes lifestyle changes and reducing exposure to cold temperatures. Medicines may be used for severe cases of the condition. Contact your health care provider if your condition worsens despite treatment. This information is not intended to replace advice given to you by your health care provider. Make sure you discuss any questions you have with your health care provider. Document Revised: 12/18/2020 Document Reviewed: 12/18/2020 Elsevier Patient Education  2022 [employer].

## 2021-11-04 NOTE — Progress Notes (Signed)
Patient: Cynthia Huang  DOB: 10-Jan-1986 MRN: [id number] PCP: Pcp, No  Referring Provider: Dr. Dairl Ponder   Subjective:  Alinda Sierras de Jayme Cloud is a 36 y.o. 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   Saw Dr. Briant Sites PA a week ago and repeated 6-day methylprednisolone taper. She does report that this has slowly improved.   Saw the rheumatologist and all follow up blood work was normal and not concerning for rheumatologic/autoimmune disease. She does bring a concern today that she may have Raynaud's phenomenon and has some very cold hands/fingers with slight reddened appearance at times. She has not had any drainage from her finger over the last 3 weeks. Has continued to do prescribed soaks as outlined by ortho team.    Review of Systems  Constitutional:  Negative for chills, fever, malaise/fatigue and weight loss.  HENT:  Negative for sore throat.   Respiratory:  Negative for cough and sputum production.   Cardiovascular:  Negative for chest pain and leg swelling.  Gastrointestinal:  Negative for abdominal pain, diarrhea and vomiting.  Genitourinary:  Negative for dysuria and flank pain.  Musculoskeletal:  Positive for joint pain (Rt long finger tip). Negative for myalgias and neck pain.  Skin:  Negative for rash.  Neurological:  Negative for dizziness, tingling and headaches.  Psychiatric/Behavioral:  Negative for depression and substance abuse. The patient is not nervous/anxious and does not have insomnia.      Past Medical History:  Diagnosis Date   Hemorrhoids     Outpatient Medications Prior to Visit  Medication Sig Dispense Refill   Ascorbic Acid (VITAMIN C PO) Take 1 tablet by mouth daily.     Cholecalciferol 125 MCG (5000 UT) capsule Take by mouth.     levonorgestrel (MIRENA) 20 MCG/DAY IUD by Intrauterine route.     zinc gluconate 50 MG tablet Take by mouth.     FOLIC ACID PO Take 1 tablet by mouth daily.  (Patient not taking: Reported on 11/04/2021)     Multiple Vitamin (MULTIVITAMIN) tablet Take 2 tablets by mouth daily. (Patient not taking: Reported on 11/04/2021)     No facility-administered medications prior to visit.     No Known Allergies  Social History   Tobacco Use   Smoking status: Never   Smokeless tobacco: Never  Substance Use Topics   Alcohol use: Never   Drug use: Never    No family history on file.  Objective:   Vitals:   11/04/21 1343  BP: 117/76  Pulse: 80  Temp: 98.3 F (36.8 C)  TempSrc: Temporal  SpO2: 98%  Weight: 116 lb (52.6 kg)   There is no height or weight on file to calculate BMI.  Physical Exam Constitutional:      Appearance: Normal appearance. She is not ill-appearing.  HENT:     Mouth/Throat:     Mouth: Mucous membranes are moist.     Pharynx: Oropharynx is clear.  Eyes:     General: No scleral icterus. Pulmonary:     Effort: Pulmonary effort is normal.  Neurological:     Mental Status: She is oriented to person, place, and time.  Psychiatric:        Mood and Affect: Mood normal.        Thought Content: Thought content normal.       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 Pain is intermittent. I don't see any concern or need to resume antimicrobials at this time given improvement on steroids alone. May have some chronic nailbed damage that will be slow to heal. I wonder if topical triamcinolone would be better option for her long term if she has recurrent swelling so she only has local effect of steroid. Will defer to Dr. Ronie Spies team. She can follow up here PRN if there is concern for recurrent  infection symptoms. One thing to consider would be to use pseudomonas coverage (levaquin daily) as this was one gap in microbial coverage from previous treatments.   She is interested in going back to work. I have no problems with her return to work with keeping hands gloved and covered, avoiding stagnant water/harsh cleaning products. Ultimately would defer to Dr. Mina Marble regarding return and I asked her to call his office to ensure that was OK.  ?component of raynaud's brought up from her today. Certainly this would be a cause of delay in healing. In reviewing pictures she does not attest to any severe whitening of the finger tips. General recommendations provided today. Not sure she needs CCB for this but would prefer to have someone follow her with BP management, like PCP.       Rexene Alberts, MSN, NP-C [facility] Pager: [phone] Office: [phone]  11/04/21  2:33 PM

## 2021-11-04 NOTE — Assessment & Plan Note (Signed)
Compared to 3 weeks ago I do see she has had improvement in swelling. Pain is intermittent. I don't see any concern or need to resume antimicrobials at this time given improvement on steroids alone. May have some chronic nailbed damage that will be slow to heal. I wonder if topical triamcinolone would be better option for her long term if she has recurrent swelling so she only has local effect of steroid. Will defer to Dr. Ronie Spies team. She can follow up here PRN if there is concern for recurrent infection symptoms. One thing to consider would be to use pseudomonas coverage (levaquin daily) as this was one gap in microbial coverage from previous treatments.   She is interested in going back to work. I have no problems with her return to work with keeping hands gloved and covered, avoiding stagnant water/harsh cleaning products. Ultimately would defer to Dr. Mina Marble regarding return and I asked her to call his office to ensure that was OK.  ?component of raynaud's brought up from her today. Certainly this would be a cause of delay in healing. In reviewing pictures she does not attest to any severe whitening of the finger tips. General recommendations provided today. Not sure she needs CCB for this but would prefer to have someone follow her with BP management, like PCP.
